# Patient Record
Sex: Female | Born: 1965 | Race: White | Hispanic: No | Marital: Married | Smoking: Never smoker
Health system: Southern US, Community
[De-identification: ages and names within clinical notes are randomized; demographics above are authoritative.]

## PROBLEM LIST (undated history)

## (undated) DIAGNOSIS — T7840XA Allergy, unspecified, initial encounter: Secondary | ICD-10-CM

## (undated) HISTORY — PX: TUBAL LIGATION: SHX77

## (undated) HISTORY — DX: Allergy, unspecified, initial encounter: T78.40XA

---

## 1998-03-31 ENCOUNTER — Ambulatory Visit (HOSPITAL_COMMUNITY): Admission: RE | Admit: 1998-03-31 | Discharge: 1998-03-31 | Payer: Self-pay | Admitting: Obstetrics & Gynecology

## 1998-06-20 ENCOUNTER — Inpatient Hospital Stay (HOSPITAL_COMMUNITY): Admission: AD | Admit: 1998-06-20 | Discharge: 1998-06-23 | Payer: Self-pay

## 1998-07-01 ENCOUNTER — Encounter (HOSPITAL_COMMUNITY): Admission: RE | Admit: 1998-07-01 | Discharge: 1998-09-29 | Payer: Self-pay | Admitting: *Deleted

## 2000-12-19 ENCOUNTER — Other Ambulatory Visit: Admission: RE | Admit: 2000-12-19 | Discharge: 2000-12-19 | Payer: Self-pay | Admitting: Obstetrics & Gynecology

## 2002-03-07 ENCOUNTER — Other Ambulatory Visit: Admission: RE | Admit: 2002-03-07 | Discharge: 2002-03-07 | Payer: Self-pay | Admitting: Obstetrics & Gynecology

## 2003-08-07 ENCOUNTER — Other Ambulatory Visit: Admission: RE | Admit: 2003-08-07 | Discharge: 2003-08-07 | Payer: Self-pay | Admitting: Obstetrics & Gynecology

## 2003-11-27 ENCOUNTER — Ambulatory Visit (HOSPITAL_COMMUNITY): Admission: RE | Admit: 2003-11-27 | Discharge: 2003-11-27 | Payer: Self-pay | Admitting: Obstetrics & Gynecology

## 2004-01-22 ENCOUNTER — Inpatient Hospital Stay (HOSPITAL_COMMUNITY): Admission: AD | Admit: 2004-01-22 | Discharge: 2004-01-22 | Payer: Self-pay | Admitting: Obstetrics and Gynecology

## 2004-03-02 ENCOUNTER — Observation Stay (HOSPITAL_COMMUNITY): Admission: AD | Admit: 2004-03-02 | Discharge: 2004-03-02 | Payer: Self-pay | Admitting: Obstetrics and Gynecology

## 2004-03-04 ENCOUNTER — Inpatient Hospital Stay (HOSPITAL_COMMUNITY): Admission: AD | Admit: 2004-03-04 | Discharge: 2004-03-04 | Payer: Self-pay | Admitting: Obstetrics and Gynecology

## 2004-03-06 ENCOUNTER — Inpatient Hospital Stay (HOSPITAL_COMMUNITY): Admission: AD | Admit: 2004-03-06 | Discharge: 2004-03-08 | Payer: Self-pay | Admitting: Obstetrics & Gynecology

## 2004-03-19 ENCOUNTER — Encounter: Admission: RE | Admit: 2004-03-19 | Discharge: 2004-04-08 | Payer: Self-pay | Admitting: Obstetrics & Gynecology

## 2004-04-15 ENCOUNTER — Other Ambulatory Visit: Admission: RE | Admit: 2004-04-15 | Discharge: 2004-04-15 | Payer: Self-pay | Admitting: Obstetrics & Gynecology

## 2005-04-29 ENCOUNTER — Other Ambulatory Visit: Admission: RE | Admit: 2005-04-29 | Discharge: 2005-04-29 | Payer: Self-pay | Admitting: Obstetrics & Gynecology

## 2010-10-30 ENCOUNTER — Other Ambulatory Visit: Payer: Self-pay | Admitting: Obstetrics & Gynecology

## 2010-10-30 DIAGNOSIS — R928 Other abnormal and inconclusive findings on diagnostic imaging of breast: Secondary | ICD-10-CM

## 2010-11-02 ENCOUNTER — Other Ambulatory Visit: Payer: Self-pay | Admitting: Family Medicine

## 2010-11-05 ENCOUNTER — Ambulatory Visit
Admission: RE | Admit: 2010-11-05 | Discharge: 2010-11-05 | Disposition: A | Discharge status: Home / Self Care | Payer: Federal, State, Local not specified - PPO | Source: Ambulatory Visit | Attending: Obstetrics & Gynecology | Admitting: Obstetrics & Gynecology

## 2010-11-05 DIAGNOSIS — R928 Other abnormal and inconclusive findings on diagnostic imaging of breast: Secondary | ICD-10-CM

## 2010-11-05 IMAGING — MG MM DIGITAL DIAGNOSTIC UNILAT R {BCG}
2 series · 2 of 2 positions shown · non-contrast
Comparison: [DATE] and prior mammograms dating back to
[DATE]

CLINICAL DATA: Abnormal screening mammogram with possible right
breast mass.

DIGITAL DIAGNOSTIC RIGHT MAMMOGRAM with CAD

[R CC]
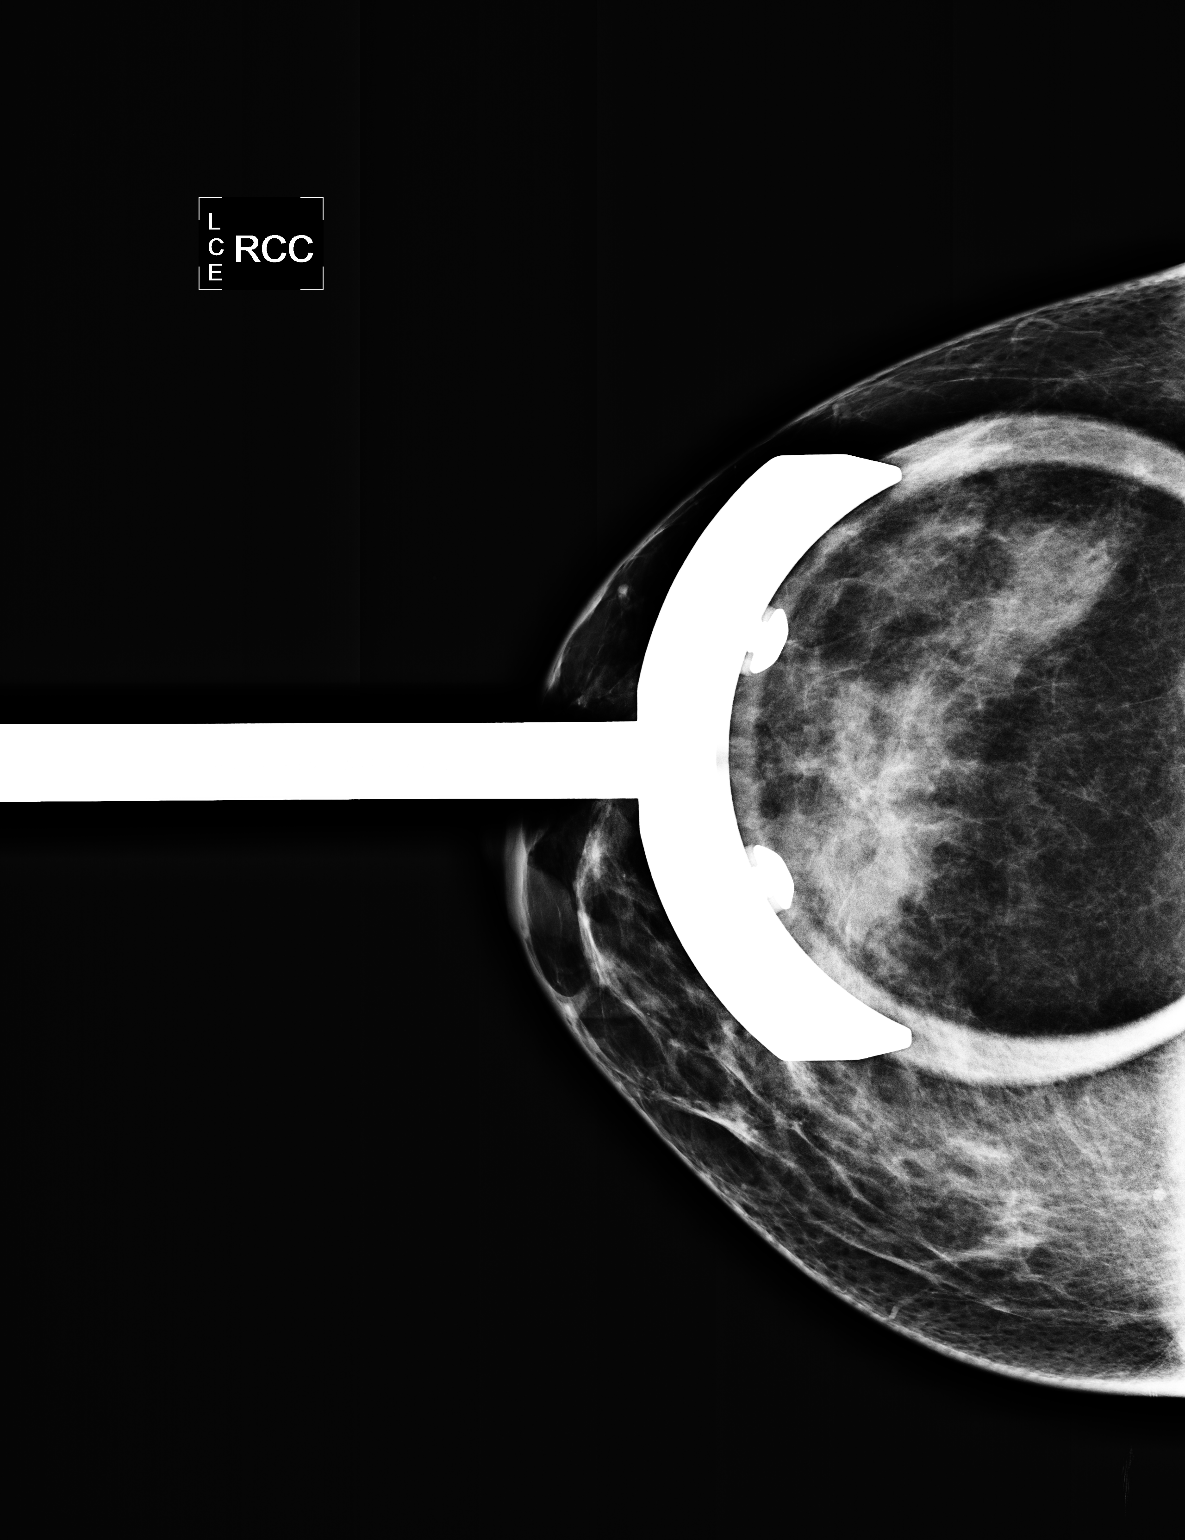

[R ML]
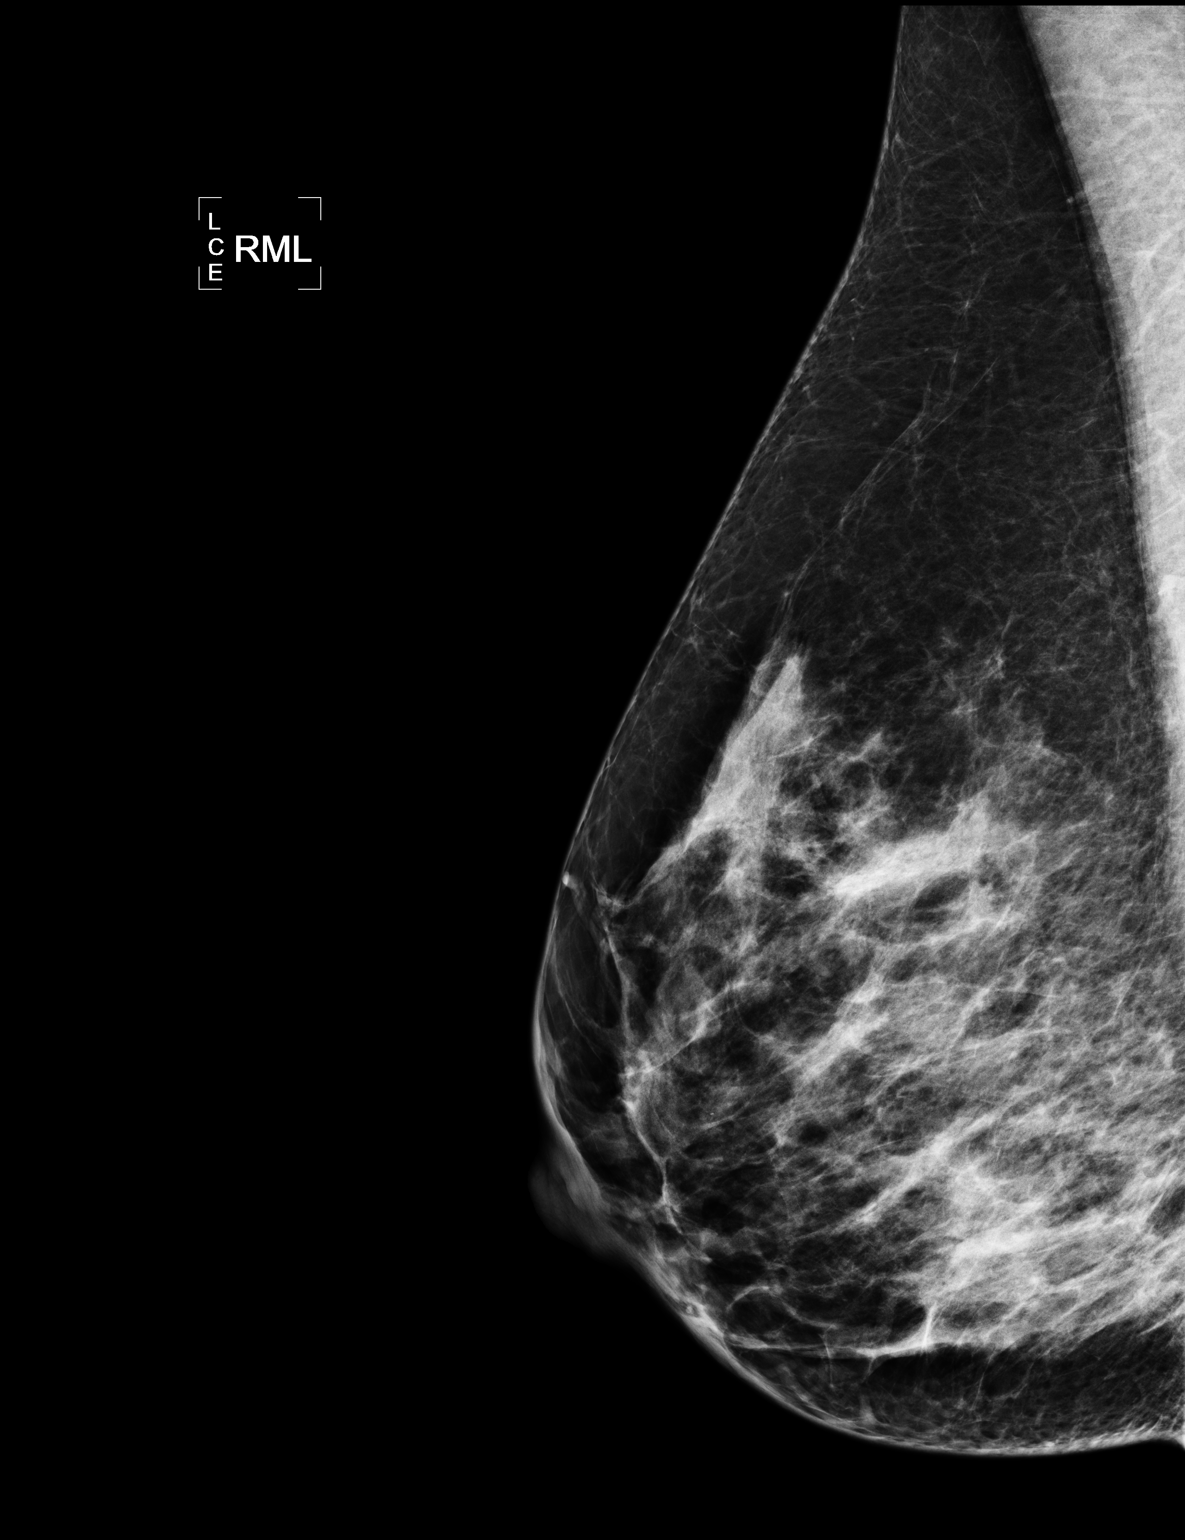

[2 of 2 positions shown; findings below may reference images not displayed]

FINDINGS: CC spot compression and ML views of the right breast are
performed in the area of the screening study finding. There is no
evidence of persistent mass, distortion, or suspicious
calcifications.
The breast tissue is heterogeneously dense.

Mammographic images were processed with CAD.
IMPRESSION: No mammographic evidence of malignancy or abnormality, specifically
in the area of the screening study finding.

These findings were discussed with the patient.  She was encouraged
to begin/continue monthly self exams and to contact her primary
physician if any changes noted.

BI-RADS CATEGORY 1:  Negative.

Recommend bilateral screening mammograms in 1 year.

## 2013-08-23 ENCOUNTER — Encounter: Payer: Self-pay | Admitting: Family Medicine

## 2013-08-23 ENCOUNTER — Ambulatory Visit (INDEPENDENT_AMBULATORY_CARE_PROVIDER_SITE_OTHER): Payer: Federal, State, Local not specified - PPO | Admitting: Family Medicine

## 2013-08-23 VITALS — BP 112/66 | HR 57 | Temp 98.4°F | Resp 16 | Ht 64.0 in | Wt 142.8 lb

## 2013-08-23 DIAGNOSIS — Z0000 Encounter for general adult medical examination without abnormal findings: Secondary | ICD-10-CM

## 2013-08-23 DIAGNOSIS — Z13220 Encounter for screening for lipoid disorders: Secondary | ICD-10-CM

## 2013-08-23 DIAGNOSIS — Z130 Encounter for screening for diseases of the blood and blood-forming organs and certain disorders involving the immune mechanism: Secondary | ICD-10-CM

## 2013-08-23 DIAGNOSIS — Z13228 Encounter for screening for other metabolic disorders: Secondary | ICD-10-CM

## 2013-08-23 DIAGNOSIS — Z1329 Encounter for screening for other suspected endocrine disorder: Secondary | ICD-10-CM

## 2013-08-23 LAB — CBC WITH DIFFERENTIAL/PLATELET
BASOS ABS: 0.1 10*3/uL (ref 0.0–0.1)
Basophils Relative: 1 % (ref 0–1)
Eosinophils Absolute: 0.1 10*3/uL (ref 0.0–0.7)
Eosinophils Relative: 1 % (ref 0–5)
HEMATOCRIT: 41.5 % (ref 36.0–46.0)
Hemoglobin: 14.2 g/dL (ref 12.0–15.0)
LYMPHS PCT: 33 % (ref 12–46)
Lymphs Abs: 1.9 10*3/uL (ref 0.7–4.0)
MCH: 29.9 pg (ref 26.0–34.0)
MCHC: 34.2 g/dL (ref 30.0–36.0)
MCV: 87.4 fL (ref 78.0–100.0)
Monocytes Absolute: 0.4 10*3/uL (ref 0.1–1.0)
Monocytes Relative: 7 % (ref 3–12)
NEUTROS ABS: 3.4 10*3/uL (ref 1.7–7.7)
NEUTROS PCT: 58 % (ref 43–77)
PLATELETS: 290 10*3/uL (ref 150–400)
RBC: 4.75 MIL/uL (ref 3.87–5.11)
RDW: 13.3 % (ref 11.5–15.5)
WBC: 5.8 10*3/uL (ref 4.0–10.5)

## 2013-08-23 NOTE — Patient Instructions (Signed)

## 2013-08-23 NOTE — Progress Notes (Signed)
Subjective:    Patient ID: Elizabeth Winters, female    DOB: May 28, 1965, 48 y.o.   MRN: [id number]  HPI  This 48 y.o. Cauc female is here for CPE for insurance purposes. She enjoys excellent health; GYN care is provided elsewhere (Dr. Jennette KettleNeal) and is current.  PMHx, Surg Hx, Soc and Fam Hx reviewed.  No chronic medications.  Review of Systems  Constitutional: Negative.   HENT: Negative.   Eyes: Negative.   Respiratory: Negative.   Cardiovascular: Negative.   Gastrointestinal: Negative.   Endocrine: Negative.   Genitourinary: Negative.   Musculoskeletal: Negative.   Skin: Negative.   Allergic/Immunologic: Negative.   Neurological: Negative.   Hematological: Negative.   Psychiatric/Behavioral: Negative.       Objective:   Physical Exam  Nursing note and vitals reviewed. Constitutional: She is oriented to person, place, and time. Vital signs are normal. She appears well-developed and well-nourished. No distress.  HENT:  Head: Normocephalic and atraumatic.  Right Ear: Hearing, tympanic membrane, external ear and ear canal normal.  Left Ear: Hearing, tympanic membrane, external ear and ear canal normal.  Nose: Nose normal. No nasal deformity or septal deviation.  Mouth/Throat: Uvula is midline, oropharynx is clear and moist and mucous membranes are normal. No oral lesions. Normal dentition. No dental caries.  Eyes: Conjunctivae, EOM and lids are normal. Pupils are equal, round, and reactive to light. No scleral icterus.  Fundoscopic exam:      The right eye shows no arteriolar narrowing, no AV nicking and no papilledema. The right eye shows red reflex.       The left eye shows no arteriolar narrowing and no papilledema. The left eye shows red reflex.  Wears corrective lenses.  Neck: Trachea normal, normal range of motion and full passive range of motion without pain. Neck supple. No spinous process tenderness and no muscular tenderness present. No mass and no thyromegaly  present.  Cardiovascular: Normal rate, regular rhythm, S1 normal, S2 normal, normal heart sounds and normal pulses.   No extrasystoles are present. PMI is not displaced.  Exam reveals no gallop and no friction rub.   No murmur heard. Pulmonary/Chest: Effort normal and breath sounds normal. No respiratory distress. She has no decreased breath sounds.  Abdominal: Soft. Normal appearance and bowel sounds are normal. She exhibits no distension, no pulsatile midline mass and no mass. There is no hepatosplenomegaly. There is no tenderness. There is no guarding and no CVA tenderness.  Musculoskeletal:       Cervical back: Normal.       Thoracic back: Normal.       Lumbar back: Normal.  Remainder of exam unremarkable.  Lymphadenopathy:       Head (right side): No submental, no submandibular, no tonsillar, no preauricular, no posterior auricular and no occipital adenopathy present.       Head (left side): No submental, no submandibular, no tonsillar, no preauricular, no posterior auricular and no occipital adenopathy present.    She has no cervical adenopathy.       Right: No inguinal and no supraclavicular adenopathy present.       Left: No inguinal and no supraclavicular adenopathy present.  Neurological: She is alert and oriented to person, place, and time. She has normal strength and normal reflexes. She displays no atrophy. No cranial nerve deficit or sensory deficit. She exhibits normal muscle tone. Coordination and gait normal. She displays no Babinski's sign on the right side. She displays no Babinski's sign on the  left side.  Skin: Skin is warm, dry and intact. No ecchymosis, no lesion and no rash noted. She is not diaphoretic. No cyanosis or erythema. No pallor. Nails show no clubbing.  Psychiatric: She has a normal mood and affect. Her speech is normal and behavior is normal. Judgment and thought content normal. Cognition and memory are normal.      Assessment & Plan:  Normal routine history  and physical examination - Plan: COMPLETE METABOLIC PANEL WITH GFR  Screening for deficiency anemia - Plan: CBC with Differential  Screening for lipoid disorders - Plan: LDL Cholesterol, Direct, COMPLETE METABOLIC PANEL WITH GFR  Screening for metabolic disorder - Plan: COMPLETE METABOLIC PANEL WITH GFR  Laboratory examination ordered as part of a complete physical examination - Plan: COMPLETE METABOLIC PANEL WITH GFR.

## 2013-08-24 ENCOUNTER — Encounter: Payer: Self-pay | Admitting: Family Medicine

## 2013-08-24 DIAGNOSIS — Z0000 Encounter for general adult medical examination without abnormal findings: Secondary | ICD-10-CM | POA: Insufficient documentation

## 2013-08-24 LAB — LDL CHOLESTEROL, DIRECT: LDL DIRECT: 79 mg/dL

## 2013-08-24 LAB — COMPLETE METABOLIC PANEL WITH GFR
ALBUMIN: 4.2 g/dL (ref 3.5–5.2)
ALK PHOS: 46 U/L (ref 39–117)
ALT: 11 U/L (ref 0–35)
AST: 14 U/L (ref 0–37)
BUN: 10 mg/dL (ref 6–23)
CO2: 29 mEq/L (ref 19–32)
Calcium: 9.1 mg/dL (ref 8.4–10.5)
Chloride: 102 mEq/L (ref 96–112)
Creat: 0.71 mg/dL (ref 0.50–1.10)
GFR, Est African American: >89 mL/min
GFR, Est Non African American: >89 mL/min
Glucose, Bld: 108 mg/dL — ABNORMAL HIGH (ref 70–99)
POTASSIUM: 4.3 meq/L (ref 3.5–5.3)
SODIUM: 137 meq/L (ref 135–145)
TOTAL PROTEIN: 6.7 g/dL (ref 6.0–8.3)
Total Bilirubin: 0.6 mg/dL (ref 0.2–1.2)

## 2015-04-22 ENCOUNTER — Ambulatory Visit (INDEPENDENT_AMBULATORY_CARE_PROVIDER_SITE_OTHER): Payer: Federal, State, Local not specified - PPO | Admitting: Urgent Care

## 2015-04-22 VITALS — BP 116/72 | HR 74 | Temp 98.6°F | Resp 18 | Ht 64.0 in | Wt 150.0 lb

## 2015-04-22 DIAGNOSIS — J029 Acute pharyngitis, unspecified: Secondary | ICD-10-CM | POA: Diagnosis not present

## 2015-04-22 MED ORDER — AMOXICILLIN 875 MG PO TABS: 875.0000 mg | ORAL_TABLET | Freq: Two times a day (BID) | ORAL | Status: DC

## 2015-04-22 NOTE — Progress Notes (Signed)
    MRN: [id number] DOB: 15-Apr-1965  Subjective:   Elizabeth Winters is a 50 y.o. female presenting for chief complaint of Sore Throat  Reports feeling scratchy and sore throat, bilateral ear pressure that started this morning. Patient has 1 son who was diagnosed with strep throat and has been taking care of him, in very close proximity. Denies fever, sinus pain, congestion, cough, chest pain, shob.  [name] currently has no medications in their medication list. Also has No Known Allergies.  Elizabeth Winters  has a past medical history of Allergy. Also  has past surgical history that includes Tubal ligation.  Objective:   Vitals: BP 116/72 mmHg  Pulse 74  Temp(Src) 98.6 F (37 C) (Oral)  Resp 18  Ht  (1.626 m)  Wt 150 lb (68.04 kg)  BMI 25.73 kg/m2  SpO2 98%  Physical Exam  Constitutional: She is oriented to person, place, and time. She appears well-developed and well-nourished.  HENT:  TM's intact bilaterally, no effusions or erythema. Nasal turbinates pink and moist, nasal passages patent. No sinus tenderness. Oropharynx clear, mucous membranes moist, dentition in good repair.  Eyes: Right eye exhibits no discharge. Left eye exhibits no discharge. No scleral icterus.  Neck: Normal range of motion. Neck supple.  Cardiovascular: Normal rate, regular rhythm and intact distal pulses.  Exam reveals no gallop and no friction rub.   No murmur heard. Pulmonary/Chest: No respiratory distress. She has no wheezes. She has no rales.  Lymphadenopathy:    She has cervical adenopathy (bilateral, anterior, R>L).  Neurological: She is alert and oriented to person, place, and time.  Skin: Skin is warm and dry.   Assessment and Plan :   1. Sore throat - Counseled patient on Strep throat. She is to use supportive care, start Amoxicillin (printed script) only if she develops symptoms as outlined in her instructions. RTC in 1-2 weeks if no improvement otherwise.  Wallis Bamberg,  PA-C [facility] [phone] 04/22/2015 6:54 PM

## 2015-04-22 NOTE — Patient Instructions (Signed)

## 2015-11-13 DIAGNOSIS — K080 Exfoliation of teeth due to systemic causes: Secondary | ICD-10-CM | POA: Diagnosis not present

## 2015-11-17 DIAGNOSIS — Z01419 Encounter for gynecological examination (general) (routine) without abnormal findings: Secondary | ICD-10-CM | POA: Diagnosis not present

## 2015-11-17 DIAGNOSIS — Z6825 Body mass index (BMI) 25.0-25.9, adult: Secondary | ICD-10-CM | POA: Diagnosis not present

## 2015-11-17 DIAGNOSIS — Z1231 Encounter for screening mammogram for malignant neoplasm of breast: Secondary | ICD-10-CM | POA: Diagnosis not present

## 2016-05-13 DIAGNOSIS — K080 Exfoliation of teeth due to systemic causes: Secondary | ICD-10-CM | POA: Diagnosis not present

## 2016-10-19 ENCOUNTER — Encounter: Payer: Self-pay | Admitting: Family Medicine

## 2016-10-19 ENCOUNTER — Ambulatory Visit (INDEPENDENT_AMBULATORY_CARE_PROVIDER_SITE_OTHER): Payer: Federal, State, Local not specified - PPO | Admitting: Family Medicine

## 2016-10-19 VITALS — BP 126/78 | HR 55 | Temp 98.0°F | Resp 17 | Ht 64.5 in | Wt 150.0 lb

## 2016-10-19 DIAGNOSIS — Z7189 Other specified counseling: Secondary | ICD-10-CM

## 2016-10-19 DIAGNOSIS — Z23 Encounter for immunization: Secondary | ICD-10-CM

## 2016-10-19 DIAGNOSIS — M549 Dorsalgia, unspecified: Secondary | ICD-10-CM

## 2016-10-19 MED ORDER — NAPROXEN 500 MG PO TABS: 500.0000 mg | ORAL_TABLET | Freq: Two times a day (BID) | ORAL | 0 refills | Status: AC

## 2016-10-19 MED ORDER — CYCLOBENZAPRINE HCL 5 MG PO TABS: 5.0000 mg | ORAL_TABLET | Freq: Three times a day (TID) | ORAL | 0 refills | Status: AC | PRN

## 2016-10-19 NOTE — Patient Instructions (Addendum)
   IF you received an x-ray today, you will receive an invoice from [facility]. Please contact [facility] at [phone] with questions or concerns regarding your invoice.   IF you received labwork today, you will receive an invoice from [facility]. Please contact [facility] at [phone] with questions or concerns regarding your invoice.   Our billing staff will not be able to assist you with questions regarding bills from these companies.  You will be contacted with the lab results as soon as they are available. The fastest way to get your results is to activate your My Chart account. Instructions are located on the last page of this paperwork. If you have not heard from us regarding the results in 2 weeks, please contact this office.      Thoracic Strain A thoracic strain, which is sometimes called a mid-back strain, is an injury to the muscles or tendons that attach to the upper part of your back behind your chest. This type of injury occurs when a muscle is overstretched or overloaded. Thoracic strains can range from mild to severe. Mild strains may involve stretching a muscle or tendon without tearing it. These injuries may heal in 1-2 weeks. More severe strains involve tearing of muscle fibers or tendons. These will cause more pain and may take 6-8 weeks to heal. What are the causes? This condition may be caused by:  An injury in which a sudden force is placed on the muscle.  Exercising without properly warming up.  Overuse of the muscle.  Improper form during certain movements.  Other injuries that surround or cause stress on the mid-back, causing a strain on the muscles.  In some cases, the cause may not be known. What increases the risk? This injury is more common in:  Athletes.  People with obesity.  What are the signs or symptoms? The main symptom of this condition is pain, especially with movement. Other symptoms  include:  Bruising.  Swelling.  Spasm.  How is this diagnosed? This condition may be diagnosed with a physical exam. X-rays may be taken to check for a fracture. How is this treated? This condition may be treated with:  Resting and icing the injured area.  Physical therapy. This will involve doing stretching and strengthening exercises.  Medicines for pain and inflammation.  Follow these instructions at home:  Rest as needed. Follow instructions from your health care provider about any restrictions on activity.  If directed, apply ice to the injured area: ? Put ice in a plastic bag. ? Place a towel between your skin and the bag. ? Leave the ice on for 20 minutes, 2-3 times per day.  Take over-the-counter and prescription medicines only as told by your health care provider.  Begin doing exercises as told by your health care provider or physical therapist.  Always warm up properly before physical activity or sports.  Bend your knees before you lift heavy objects.  Keep all follow-up visits as told by your health care provider. This is important. Contact a health care provider if:  Your pain is not helped by medicine.  Your pain, bruising, or swelling is getting worse.  You have a fever. Get help right away if:  You have shortness of breath.  You have chest pain.  You develop numbness or weakness in your legs.  You have involuntary loss of urine (urinary incontinence). This information is not intended to replace advice given to you by your health care provider. Make sure you   discuss any questions you have with your health care provider. Document Released: 05/01/2003 Document Revised: 10/11/2015 Document Reviewed: 04/04/2014 Elsevier Interactive Patient Education  2018 [employer].  Colorectal Cancer Screening Colorectal cancer screening is a group of tests used to check for colorectal cancer. Colorectal refers to your colon and rectum. Your colon and rectum  are located at the end of your large intestine and carry your bowel movements out of your body. Why is colorectal cancer screening done? It is common for abnormal growths (polyps) to form in the lining of your colon, especially as you get older. These polyps can be cancerous or become cancerous. If colorectal cancer is found at an early stage, it is treatable. Who should be screened for colorectal cancer? Screening is recommended for all adults at average risk starting at age 48. Tests may be recommended every 1 to 10 years. Your health care provider may recommend earlier or more frequent screening if you have:  A history of colorectal cancer or polyps.  A family member with a history of colorectal cancer or polyps.  Inflammatory bowel disease, such as ulcerative colitis or Crohn disease.  A type of hereditary colon cancer syndrome.  Colorectal cancer symptoms.  Types of screening tests There are several types of colorectal screening tests. They include:  Guaiac-based fecal occult blood testing.  Fecal immunochemical test (FIT).  Stool DNA test.  Barium enema.  Virtual colonoscopy.  Sigmoidoscopy. During this test, a sigmoidoscope is used to examine your rectum and lower colon. A sigmoidoscope is a flexible tube with a camera that is inserted through your anus into your rectum and lower colon.  Colonoscopy. During this test, a colonoscope is used to examine your entire colon. A colonoscope is a long, thin, flexible tube with a camera. This test examines your entire colon and rectum.  This information is not intended to replace advice given to you by your health care provider. Make sure you discuss any questions you have with your health care provider. Document Released: 07/29/2009 Document Revised: 09/18/2015 Document Reviewed: 05/17/2013 Elsevier Interactive Patient Education  2017 [employer].  Thoracic Strain A thoracic strain, which is sometimes called a mid-back strain,  is an injury to the muscles or tendons that attach to the upper part of your back behind your chest. This type of injury occurs when a muscle is overstretched or overloaded. Thoracic strains can range from mild to severe. Mild strains may involve stretching a muscle or tendon without tearing it. These injuries may heal in 1-2 weeks. More severe strains involve tearing of muscle fibers or tendons. These will cause more pain and may take 6-8 weeks to heal. What are the causes? This condition may be caused by:  An injury in which a sudden force is placed on the muscle.  Exercising without properly warming up.  Overuse of the muscle.  Improper form during certain movements.  Other injuries that surround or cause stress on the mid-back, causing a strain on the muscles.  In some cases, the cause may not be known. What increases the risk? This injury is more common in:  Athletes.  People with obesity.  What are the signs or symptoms? The main symptom of this condition is pain, especially with movement. Other symptoms include:  Bruising.  Swelling.  Spasm.  How is this diagnosed? This condition may be diagnosed with a physical exam. X-rays may be taken to check for a fracture. How is this treated? This condition may be treated with:  Resting  and icing the injured area.  Physical therapy. This will involve doing stretching and strengthening exercises.  Medicines for pain and inflammation.  Follow these instructions at home:  Rest as needed. Follow instructions from your health care provider about any restrictions on activity.  If directed, apply ice to the injured area: ? Put ice in a plastic bag. ? Place a towel between your skin and the bag. ? Leave the ice on for 20 minutes, 2-3 times per day.  Take over-the-counter and prescription medicines only as told by your health care provider.  Begin doing exercises as told by your health care provider or physical  therapist.  Always warm up properly before physical activity or sports.  Bend your knees before you lift heavy objects.  Keep all follow-up visits as told by your health care provider. This is important. Contact a health care provider if:  Your pain is not helped by medicine.  Your pain, bruising, or swelling is getting worse.  You have a fever. Get help right away if:  You have shortness of breath.  You have chest pain.  You develop numbness or weakness in your legs.  You have involuntary loss of urine (urinary incontinence). This information is not intended to replace advice given to you by your health care provider. Make sure you discuss any questions you have with your health care provider. Document Released: 05/01/2003 Document Revised: 10/11/2015 Document Reviewed: 04/04/2014 Elsevier Interactive Patient Education  [employer].

## 2016-10-19 NOTE — Progress Notes (Signed)
   8/28/20189:40 AM  DIEU MINION 1965/09/14, 51 y.o. female [id number]  Chief Complaint  Patient presents with  . Back Pain    upper back of unknown origin     HPI:   Patient is a 51 y.o. female who presents today for 2 days for right upper back pain, tight, sharp, starting to radiate towards ant chest wall. Reports only possible triggering event was lifted heavy object on Saturday evening. She has tried naproxen and tylenol without much relief. She denies any neck pain, previous back issues, numbness, tingling, weakness, she denies any bowel or bladder issues.  She does not have a pcp, does see gyn every year. She reports normal pap and mammo done 11 months ago [facility].  She has never had colon cancer screening. Denies family history of colon cancer.  She is requesting seasonal flu vaccine today.  Depression screen [facility] 2/9 10/19/2016 04/22/2015 08/23/2013  Decreased Interest 0 0 0  Down, Depressed, Hopeless 0 0 0  PHQ - 2 Score 0 0 0    No Known Allergies  No current outpatient prescriptions on file prior to visit.   No current facility-administered medications on file prior to visit.     Past Medical History:  Diagnosis Date  . Allergy    seasonal    Past Surgical History:  Procedure Laterality Date  . TUBAL LIGATION      Social History  Substance Use Topics  . Smoking status: Never Smoker  . Smokeless tobacco: Never Used  . Alcohol use Yes     Comment: 5 drinks    Family History  Problem Relation Age of Onset  . Hypertension Mother   . Heart disease Father   . Hypertension Brother   . Diabetes Maternal Grandmother   . Heart disease Maternal Grandmother   . Stroke Maternal Grandmother   . Heart disease Maternal Grandfather     Review of Systems  Musculoskeletal: Positive for back pain. Negative for falls and neck pain.  Neurological: Negative for tingling, sensory change and focal weakness.      OBJECTIVE:  Blood pressure 126/78, pulse (!) 55, temperature 98 F (36.7 C), temperature source Oral, resp. rate 17, height 5' 4.5" (1.638 m), weight 150 lb (68 kg), SpO2 98 %.  Physical Exam  Constitutional: She is oriented to person, place, and time and well-developed, well-nourished, and in no distress.  HENT:  Head: Normocephalic and atraumatic.  Mouth/Throat: Oropharynx is clear and moist. No oropharyngeal exudate.  Eyes: Pupils are equal, round, and reactive to light. EOM are normal. No scleral icterus.  Neck: Normal range of motion. Neck supple.  No spine tenderness  Pulmonary/Chest: Effort normal.  Musculoskeletal: She exhibits no edema.  Back: FROM, no spine tenderness, right trapezius with visible spasm, no trigger point identified. RUE: FROM, neurovascularly intact.  Neurological: She is alert and oriented to person, place, and time. Gait normal.  Skin: Skin is warm and dry.      ASSESSMENT and PLAN:  1. Upper back pain on right side Discussed supportive measures with continued NSAIDs, moist heat, gentle stretching, adding flexeril rx. Patient educational handout given.  2. Counseling on health promotion and disease prevention Discussed colon cancer screening. Patient education handout given  3. Need for immunization against influenza Flu vaccine given today       Myles Lipps, MD [facility] [address] [place], [place] [address] Ph.  [phone] Fax [phone]

## 2016-11-15 DIAGNOSIS — K080 Exfoliation of teeth due to systemic causes: Secondary | ICD-10-CM | POA: Diagnosis not present

## 2016-11-23 DIAGNOSIS — K1329 Other disturbances of oral epithelium, including tongue: Secondary | ICD-10-CM | POA: Diagnosis not present

## 2016-11-24 DIAGNOSIS — Z1231 Encounter for screening mammogram for malignant neoplasm of breast: Secondary | ICD-10-CM | POA: Diagnosis not present

## 2016-11-24 DIAGNOSIS — Z01419 Encounter for gynecological examination (general) (routine) without abnormal findings: Secondary | ICD-10-CM | POA: Diagnosis not present

## 2016-11-24 DIAGNOSIS — Z6825 Body mass index (BMI) 25.0-25.9, adult: Secondary | ICD-10-CM | POA: Diagnosis not present

## 2016-12-01 DIAGNOSIS — Z13228 Encounter for screening for other metabolic disorders: Secondary | ICD-10-CM | POA: Diagnosis not present

## 2016-12-01 DIAGNOSIS — Z13220 Encounter for screening for lipoid disorders: Secondary | ICD-10-CM | POA: Diagnosis not present

## 2016-12-01 DIAGNOSIS — Z130 Encounter for screening for diseases of the blood and blood-forming organs and certain disorders involving the immune mechanism: Secondary | ICD-10-CM | POA: Diagnosis not present

## 2016-12-01 DIAGNOSIS — Z1329 Encounter for screening for other suspected endocrine disorder: Secondary | ICD-10-CM | POA: Diagnosis not present

## 2017-03-08 DIAGNOSIS — K080 Exfoliation of teeth due to systemic causes: Secondary | ICD-10-CM | POA: Diagnosis not present

## 2017-03-09 DIAGNOSIS — K080 Exfoliation of teeth due to systemic causes: Secondary | ICD-10-CM | POA: Diagnosis not present

## 2017-12-05 DIAGNOSIS — Z01419 Encounter for gynecological examination (general) (routine) without abnormal findings: Secondary | ICD-10-CM | POA: Diagnosis not present

## 2017-12-05 DIAGNOSIS — Z6826 Body mass index (BMI) 26.0-26.9, adult: Secondary | ICD-10-CM | POA: Diagnosis not present

## 2017-12-05 DIAGNOSIS — Z1231 Encounter for screening mammogram for malignant neoplasm of breast: Secondary | ICD-10-CM | POA: Diagnosis not present

## 2017-12-14 DIAGNOSIS — R87619 Unspecified abnormal cytological findings in specimens from cervix uteri: Secondary | ICD-10-CM | POA: Diagnosis not present

## 2017-12-21 DIAGNOSIS — N72 Inflammatory disease of cervix uteri: Secondary | ICD-10-CM | POA: Diagnosis not present

## 2017-12-21 DIAGNOSIS — R87610 Atypical squamous cells of undetermined significance on cytologic smear of cervix (ASC-US): Secondary | ICD-10-CM | POA: Diagnosis not present

## 2019-01-17 DIAGNOSIS — Z13820 Encounter for screening for osteoporosis: Secondary | ICD-10-CM | POA: Diagnosis not present

## 2019-01-17 DIAGNOSIS — Z1231 Encounter for screening mammogram for malignant neoplasm of breast: Secondary | ICD-10-CM | POA: Diagnosis not present

## 2019-02-12 DIAGNOSIS — Z01419 Encounter for gynecological examination (general) (routine) without abnormal findings: Secondary | ICD-10-CM | POA: Diagnosis not present

## 2019-02-12 DIAGNOSIS — Z6827 Body mass index (BMI) 27.0-27.9, adult: Secondary | ICD-10-CM | POA: Diagnosis not present
